# Patient Record
Sex: Male | Born: 1955 | Hispanic: Yes | Marital: Married | Smoking: Current every day smoker
Health system: Southern US, Community
[De-identification: ages and names within clinical notes are randomized; demographics above are authoritative.]

## PROBLEM LIST (undated history)

## (undated) DIAGNOSIS — I10 Essential (primary) hypertension: Secondary | ICD-10-CM

## (undated) DIAGNOSIS — E119 Type 2 diabetes mellitus without complications: Secondary | ICD-10-CM

## (undated) DIAGNOSIS — E7800 Pure hypercholesterolemia, unspecified: Secondary | ICD-10-CM

---

## 2012-09-23 DIAGNOSIS — E119 Type 2 diabetes mellitus without complications: Secondary | ICD-10-CM | POA: Insufficient documentation

## 2012-09-23 DIAGNOSIS — I10 Essential (primary) hypertension: Secondary | ICD-10-CM | POA: Insufficient documentation

## 2013-09-14 ENCOUNTER — Emergency Department: Payer: Self-pay | Admitting: Emergency Medicine

## 2015-02-22 IMAGING — CR DG KNEE COMPLETE 4+V*L*
1 series · 4 of 4 positions shown · non-contrast
Comparison: None.

CLINICAL DATA: Intermittent left knee pain for 2 months, no known
injury

EXAM:
LEFT KNEE - COMPLETE 4+ VIEW

[Series 1: t knee obl left · 0.14mm/px · 4 of 4 slices shown]
[im 1/4]
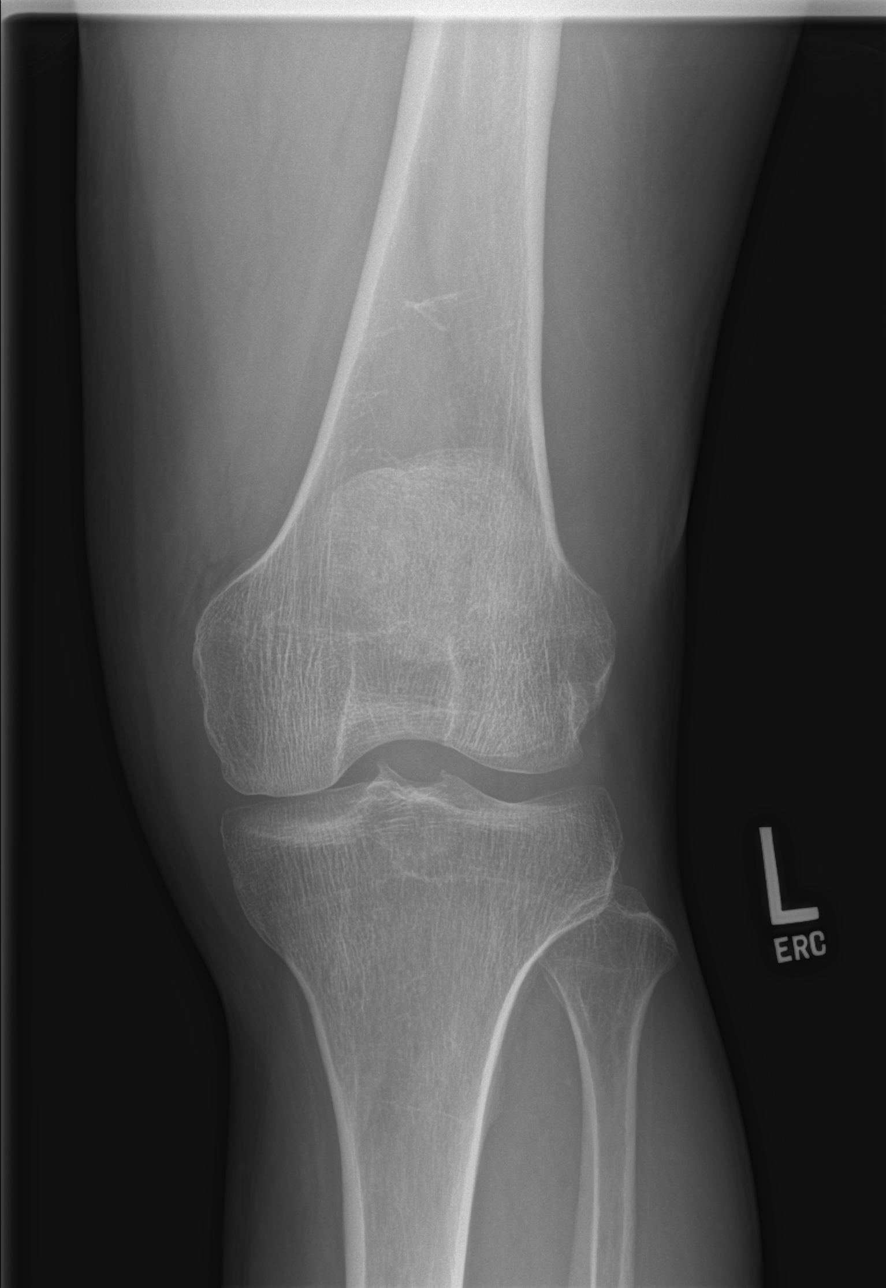
[im 2/4]
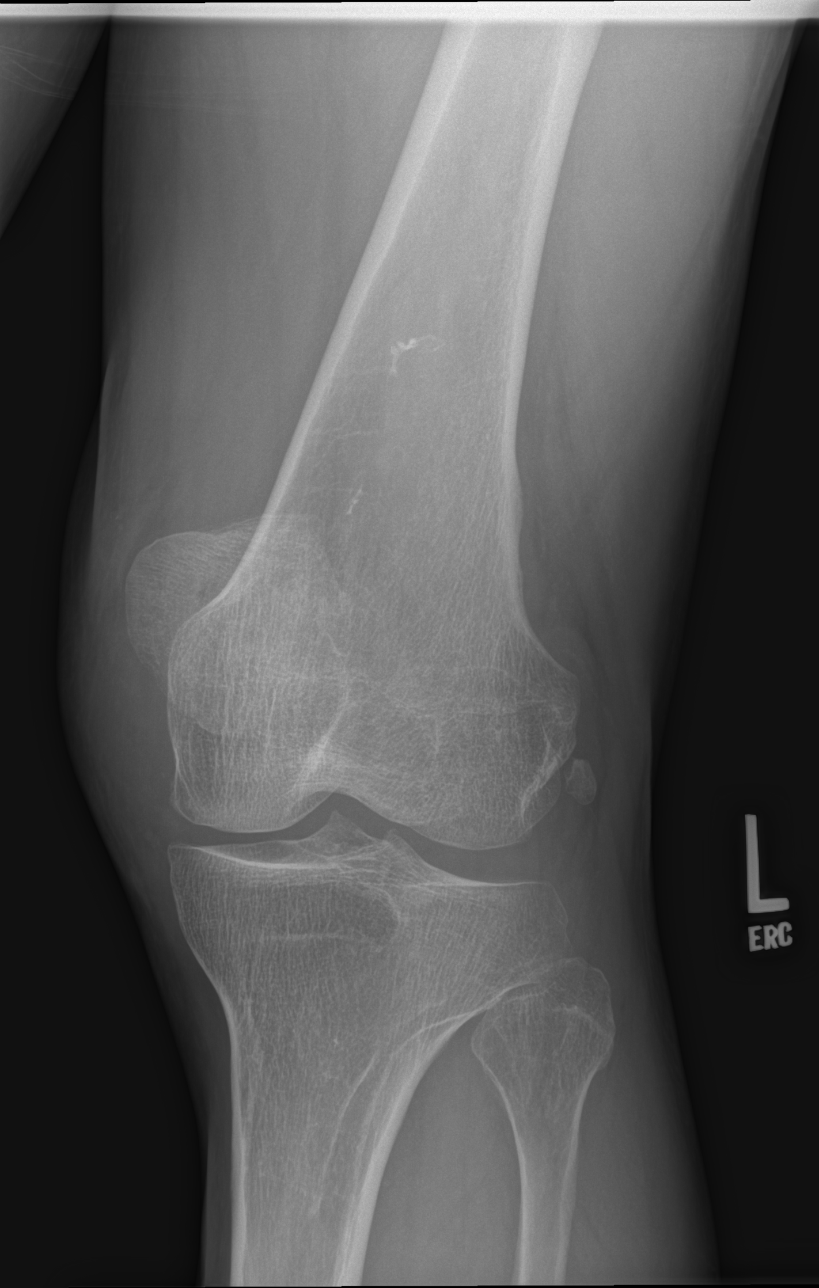
[im 3/4]
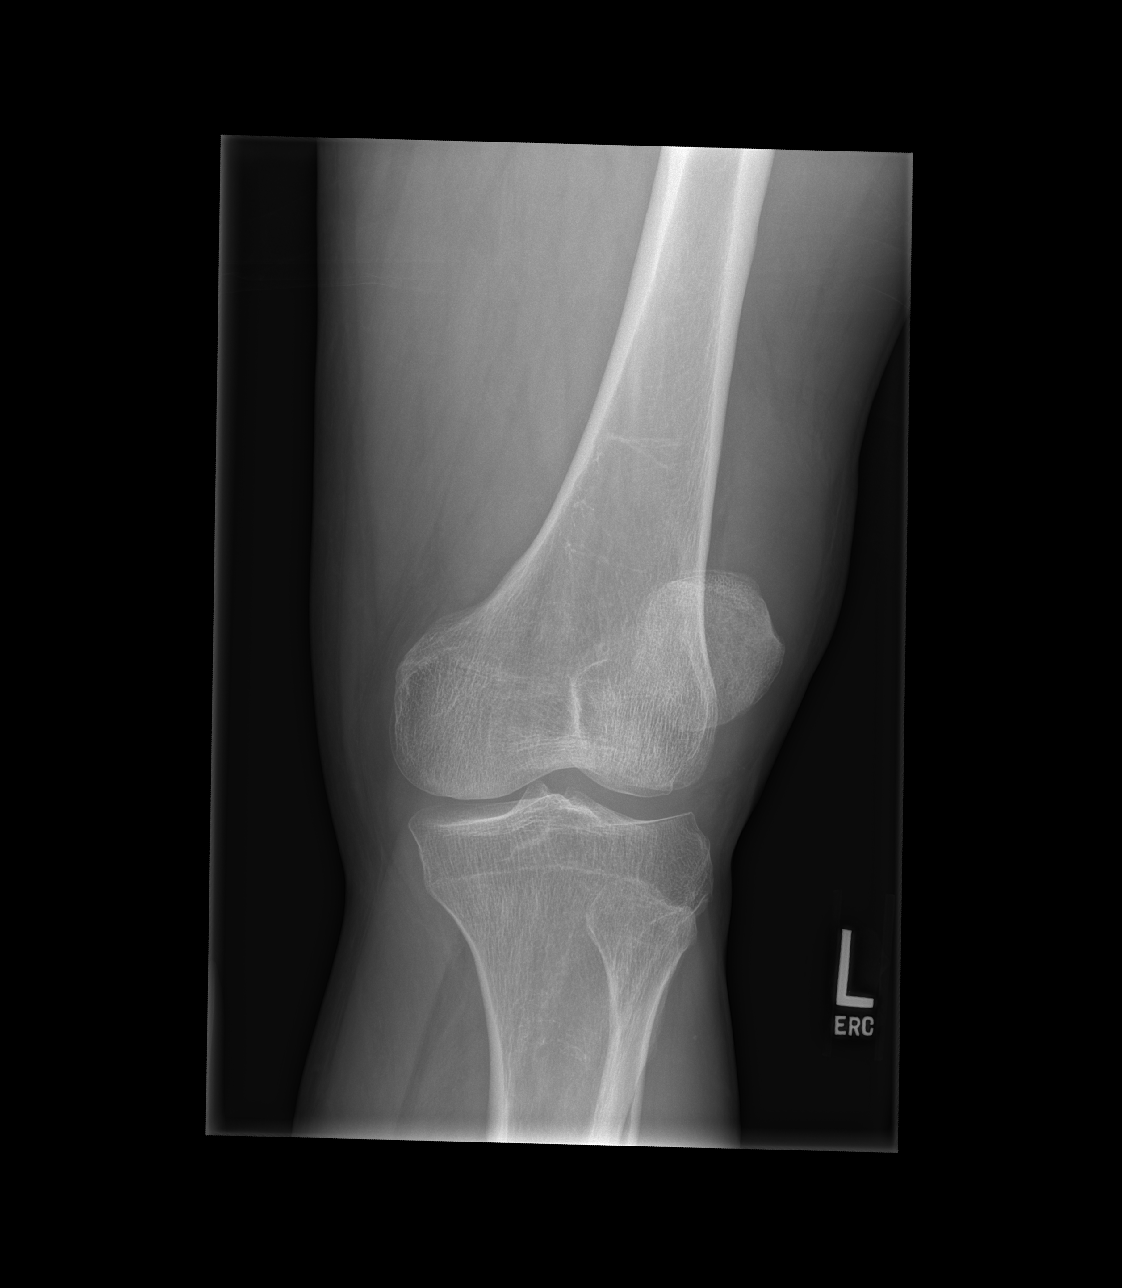
[im 4/4]
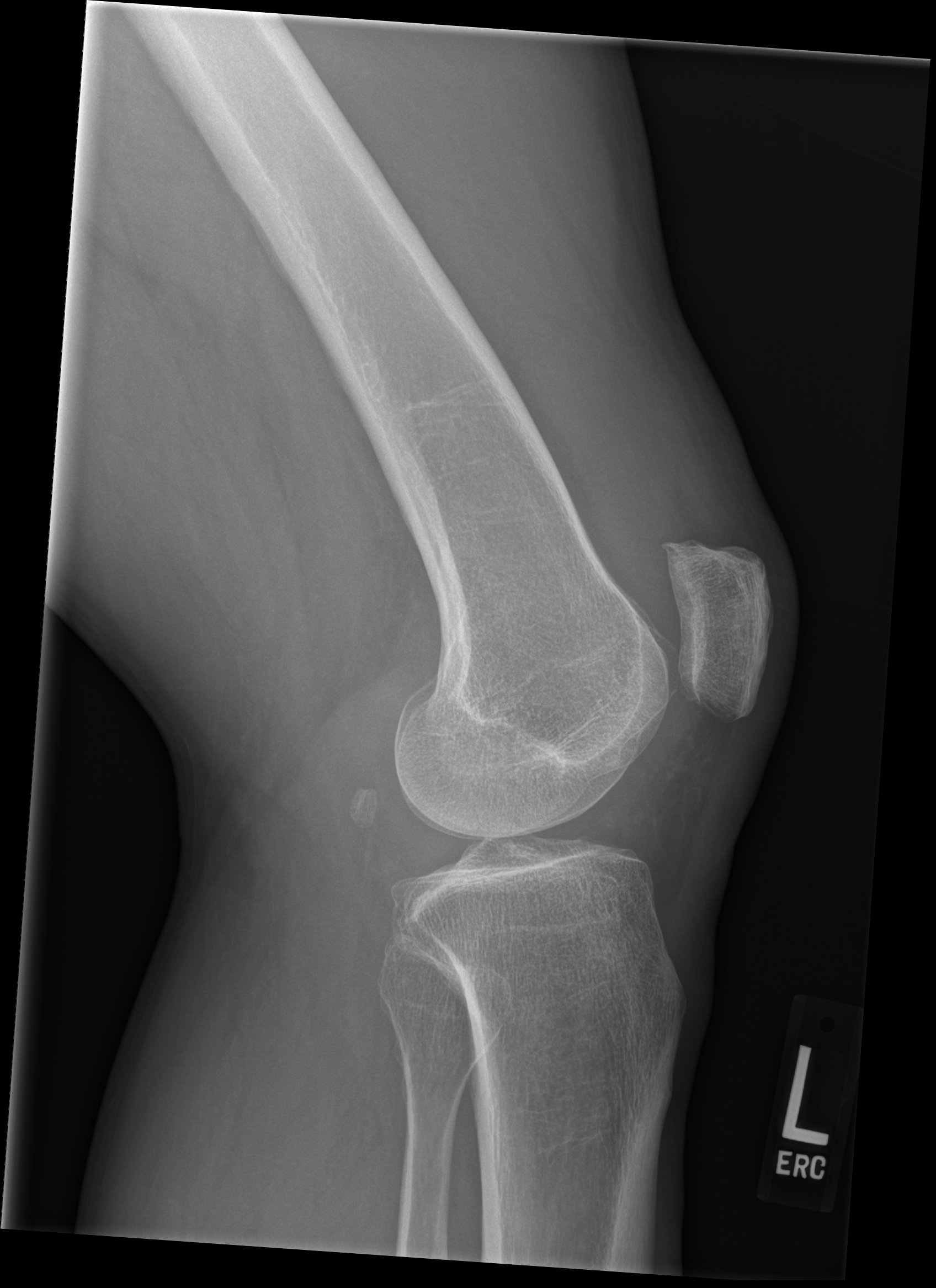

[4 of 4 positions shown; findings below may reference images not displayed]

FINDINGS: No fracture or dislocation. Mild tricompartmental degenerative
change, likely worse within the medial compartment and
patellofemoral joints with joint space loss, articular surface
irregularity, subchondral sclerosis and minimal osteophytosis. There
is minimal spurring of the tibial spines. No evidence of
chondrocalcinosis. Small suprapatellar joint effusion is suspected.
Benign linear sclerosis is noted within the distal femoral
diaphysis. Regional soft tissues are normal.
IMPRESSION: 1. Suspected small joint effusion.  Otherwise, no acute findings.
2. Mild tricompartmental degenerative change of the knee.

## 2015-12-08 ENCOUNTER — Emergency Department: Payer: No Typology Code available for payment source

## 2015-12-08 ENCOUNTER — Emergency Department
Admission: EM | Admit: 2015-12-08 | Discharge: 2015-12-09 | Disposition: A | Discharge status: Home / Self Care | Payer: No Typology Code available for payment source | Attending: Emergency Medicine | Admitting: Emergency Medicine

## 2015-12-08 ENCOUNTER — Encounter: Payer: Self-pay | Admitting: Emergency Medicine

## 2015-12-08 DIAGNOSIS — E119 Type 2 diabetes mellitus without complications: Secondary | ICD-10-CM | POA: Diagnosis not present

## 2015-12-08 DIAGNOSIS — S161XXA Strain of muscle, fascia and tendon at neck level, initial encounter: Secondary | ICD-10-CM | POA: Insufficient documentation

## 2015-12-08 DIAGNOSIS — Y999 Unspecified external cause status: Secondary | ICD-10-CM | POA: Diagnosis not present

## 2015-12-08 DIAGNOSIS — S46911A Strain of unspecified muscle, fascia and tendon at shoulder and upper arm level, right arm, initial encounter: Secondary | ICD-10-CM

## 2015-12-08 DIAGNOSIS — Y939 Activity, unspecified: Secondary | ICD-10-CM | POA: Insufficient documentation

## 2015-12-08 DIAGNOSIS — F17210 Nicotine dependence, cigarettes, uncomplicated: Secondary | ICD-10-CM | POA: Diagnosis not present

## 2015-12-08 DIAGNOSIS — I10 Essential (primary) hypertension: Secondary | ICD-10-CM | POA: Diagnosis not present

## 2015-12-08 DIAGNOSIS — T1490 Injury, unspecified: Secondary | ICD-10-CM | POA: Diagnosis present

## 2015-12-08 DIAGNOSIS — Y92411 Interstate highway as the place of occurrence of the external cause: Secondary | ICD-10-CM | POA: Insufficient documentation

## 2015-12-08 HISTORY — DX: Type 2 diabetes mellitus without complications: E11.9

## 2015-12-08 HISTORY — DX: Pure hypercholesterolemia, unspecified: E78.00

## 2015-12-08 HISTORY — DX: Essential (primary) hypertension: I10
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
## 2015-12-09 MED ORDER — NAPROXEN 500 MG PO TBEC: 500.0000 mg | DELAYED_RELEASE_TABLET | Freq: Two times a day (BID) | ORAL | Status: AC

## 2015-12-09 MED ORDER — TRAMADOL HCL 50 MG PO TABS: 50.0000 mg | ORAL_TABLET | Freq: Two times a day (BID) | ORAL | Status: AC

## 2015-12-09 MED ORDER — TRAMADOL HCL 50 MG PO TABS
50.0000 mg | ORAL_TABLET | Freq: Once | ORAL | Status: AC
  Administered 2015-12-09: 50 mg via ORAL
  Filled 2015-12-09: qty 1

## 2015-12-09 NOTE — ED Notes (Signed)
Called x-ray to ask status of pt's cervical scan. Was informed by tech that they would call the radiologist for status of results

## 2015-12-09 NOTE — ED Notes (Signed)
Used ipad translator to d/c patient
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
## 2015-12-09 NOTE — Discharge Instructions (Signed)
Distensin cervical (Cervical Sprain) La distensin cervical se produce cuando los tejidos (ligamentos) del cuello se estiran o se rompen. CUIDADOS EN EL HOGAR   Aplique hielo sobre la zona lesionada.  Ponga el hielo en una bolsa plstica.  Colquese una toalla entre la piel y la bolsa de hielo.  Deje el hielo durante 15 - 20 minutos y aplquelo 3 - 4 veces por Futures trader.  Es posible que le hayan indicado el uso de un collarn. Este collarn impide que el cuello se mueva mientras se [place].  No se lo quite excepto que se lo indique el mdico.  Si tiene el cabello largo, mantngalo fuera del collarn.  Consulte a su mdico antes de cambiar la posicin del collarn. Es posible que necesite cambiar la posicin con el tiempo para sentirse ms cmodo.  Si le permiten quitarse el collarn para lavarlo o darse un bao, siga las indicaciones de su mdico acerca de cmo hacerlo con seguridad.  Mantenga el collarn limpio pasando un pao con agua y [place]. Squelo bien. Si el collarn tiene almohadillas removibles, qutelas cada 1-2 das para lavarlas a mano con agua y [place]. Deje que se sequen al aire. Debe secarlas bien antes de volver a colocarlas en el collarn.  No conduzca vehculos mientras [place] el collarn.  Slo tome los medicamentos que le haya indicado su mdico.  Cumpla con las visitas al mdico segn las indicaciones.  Cumpla con las sesiones de fisioterapia, segn las indicaciones.  Ajuste su mesa de trabajo de modo que tenga una buena postura al trabajar.  Evite las posiciones y actividades que [employer] sntomas.  Haga un precalentamiento y elongacin adecuados antes de la [place]. SOLICITE AYUDA SI:  El dolor no se alivia con los [employer].  No puede disminuir las dosis de medicamentos para el dolor luego de un tiempo, segn lo planificado.  Su nivel de actividad no mejora segn lo esperado. SOLICITE AYUDA DE INMEDIATO SI:   Tiene sangrado.  Siente Restaurant manager, fast food.  Tiene alguna reaccin a los medicamentos.  Tiene sntomas nuevos que no puede explicar.  Pierde la sensibilidad (adormecimiento) o no Therapist, nutritional del cuerpo (parlisis).  Siente hormigueo o debilidad en alguna parte del cuerpo.  Los sntomas empeoran. Los sntomas son:  Dolor, sensibilidad, rigidez, inflamacin(hinchazn), o sensacin de ardor en el cuello.  Siente dolor cuando le tocan el cuello.  Dolor en el hombro o la zona superior de la espalda.  Capacidad limitada para mover el cuello.  Dolor de [place].  Mareos.  Debilidad, falta de sensibilidad o sensacin hormigueos en las manos o los brazos.  Espasmos musculares.  Dificultades para tragar o comer. ASEGRESE DE QUE:   Comprende estas instrucciones.  Controlar su afeccin.  Recibir ayuda de inmediato si no mejora o si empeora.   Esta informacin no tiene Theme park manager el consejo del mdico. Asegrese de hacerle al mdico cualquier pregunta que tenga.   Document Released: 08/02/2011 Document Revised: 04/15/2013 Elsevier Interactive Patient Education 2016 [employer].  Colisin con un vehculo de motor (Tourist information centre manager)  Luego de un choque con el automvil,(colisin en un vehculo de motor), es normal tener hematomas y [employer]. Durante las primeras 24 horas es cuando se Development worker, international aid. Luego, comenzar a Chiropodist.  CUIDADOS EN EL HOGAR  Aplique hielo sobre la zona lesionada.  Ponga el hielo en una bolsa plstica.  Colquese una toalla entre la piel y la bolsa de hielo.  Deje el hielo durante 15 a 20 minutos, 3 a 4 veces por da.  Beba gran cantidad de lquidos para mantener la orina de tono claro o color amarillo plido.  No beba alcohol.  Tome una ducha o un bao caliente 1 a 2 veces al C.H. Robinson Worldwideda. Esto ayudar a Manufacturing engineerdisminuir el dolor en los msculos.  Regrese a sus actividades segn las indicaciones del mdico. Janie Morningenga cuidado al levantar objetos  pesados. [place] pesos Social research officer, governmentpuede agravar el dolor de cuello o espalda.  Slo tome los medicamentos que le haya indicado el profesional. No tome aspirina. SOLICITE AYUDA DE INMEDIATO SI:   Tiene hormigueos en los brazos o las piernas, los siente dbiles o pierde la sensibilidad (estn adormecidos).  Le duele la cabeza y no mejora con medicamentos.  Siente dolor intenso en el cuello, especialmente sensibilidad en el centro de la espalda o el cuello.  No puede controlar la orina o las heces.  Siente un dolor en cualquier parte del cuerpo que empeora.  Le falta el aire, se siente mareado o se desvanece (se desmaya).  Siente dolor en el pecho.  Tiene malestar estomacal (nuseas, vmitos), o transpira.  Siente un dolor en el vientre (abdominal) que empeora.  Observa sangre en la orina, en las heces o en el vmito.  Siente dolor en los hombros (en la zona de los breteles).  Los sntomas empeoran. ASEGRESE DE QUE:   Comprende estas instrucciones.  Controlar la enfermedad.  Solicitar ayuda de inmediato si usted no mejora o si empeora.   Esta informacin no tiene Theme park managercomo fin reemplazar el consejo del mdico. Asegrese de hacerle al mdico cualquier pregunta que tenga.   Document Released: 09/15/2010 Document Revised: 11/05/2011 Elsevier Interactive Patient Education [employer]2016 Elsevier Inc.  Distensin muscular. (Muscle Strain) Una distensin muscular (estiramiento muscular) ocurre cuando un msculo se estira ms all de la longitud normal. Reece Agarcurre cuando una fuerza violenta bruscamente estira demasiado el msculo. Generalmente se desgarran algunas de las fibras del msculo. La distensin muscular es comn en los atletas. La recuperacin normalmente tarda de 1 a 2semanas. La curacin completa tarda de 5 a 6semanas.  CUIDADOS EN EL HOGAR   Siga el mtodo PRICE (por sus siglas en ingls) de tratamiento para que la lesin mejore. Hgalo durante los 2 a 3 primeros das despus de la  lesin:  Licensed conveyancerroteccin. Proteja el msculo para evitar que se vuelva a lesionar.  Reposo. Limite la actividad y descanse la parte del cuerpo lesionada.  Hielo. Ponga el hielo en una bolsa plstica. Coloque una toalla entre la piel y la bolsa de hielo. Luego aplique el hielo y djelo actuar de 15 a 20minutos por hora. Despus del Press photographertercer da, cambie a compresas de calor hmedo.  Compresin. Use una frula o venda elstica en la zona lesionada para brindar alivio. No la ajuste demasiado.  Elevacin. Eleve la zona lesionada por encima del nivel del corazn.  Solo tome los medicamentos que le haya indicado su mdico.  Realice un calentamiento antes de hacer ejercicio para prevenir distensiones musculares futuras. SOLICITE AYUDA SI:   Siente ms dolor o inflamacin (hinchazn) en la zona lesionada.  Siente adormecimiento, hormigueo o nota una prdida de fuerza en la zona lesionada. ASEGRESE DE QUE:   Comprende estas instrucciones.  Controlar su afeccin.  Recibir ayuda de inmediato si no mejora o si empeora.   Esta informacin no tiene Theme park managercomo fin reemplazar el consejo del mdico. Asegrese de hacerle al mdico cualquier pregunta que tenga.   Document Released: 11/09/2008  Document Revised: 06/03/2013 Elsevier Interactive Patient Education [employer] Inc.  Your exam and x-rays are normal today. You should take the prescription Naproxen for pain and inflammation as needed. Take the Ultram for more severe pain as needed. Apply ice packs to the neck and shoulders as needed. Follow-up with your provider as needed. Return to the emergency department for worsening symptoms as needed.  ......................................................................................................................................................................................Marland Kitchen

## 2015-12-09 NOTE — ED Notes (Signed)
Used translator to discharge pt. Reviewed d/c instructions, follow-up care, prescriptions, pain medication with pt. Pt verbalized understanding.
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
## 2017-08-30 DIAGNOSIS — Z0000 Encounter for general adult medical examination without abnormal findings: Secondary | ICD-10-CM | POA: Insufficient documentation

## 2019-09-21 DIAGNOSIS — Z6830 Body mass index (BMI) 30.0-30.9, adult: Secondary | ICD-10-CM | POA: Insufficient documentation

## 2019-09-28 DEATH — deceased

## 2021-11-24 DIAGNOSIS — E785 Hyperlipidemia, unspecified: Secondary | ICD-10-CM | POA: Insufficient documentation

## 2021-11-24 DIAGNOSIS — N400 Enlarged prostate without lower urinary tract symptoms: Secondary | ICD-10-CM | POA: Insufficient documentation

## 2022-03-23 ENCOUNTER — Other Ambulatory Visit: Payer: Self-pay

## 2022-03-23 ENCOUNTER — Emergency Department: Payer: Medicare Other

## 2022-03-23 ENCOUNTER — Emergency Department
Admission: EM | Admit: 2022-03-23 | Discharge: 2022-03-23 | Disposition: A | Discharge status: Home / Self Care | Payer: Medicare Other | Attending: Emergency Medicine | Admitting: Emergency Medicine

## 2022-03-23 DIAGNOSIS — R1013 Epigastric pain: Secondary | ICD-10-CM

## 2022-03-23 DIAGNOSIS — K529 Noninfective gastroenteritis and colitis, unspecified: Secondary | ICD-10-CM | POA: Diagnosis not present

## 2022-03-23 DIAGNOSIS — E119 Type 2 diabetes mellitus without complications: Secondary | ICD-10-CM | POA: Insufficient documentation

## 2022-03-23 DIAGNOSIS — R112 Nausea with vomiting, unspecified: Secondary | ICD-10-CM

## 2022-03-23 LAB — CBC
HCT: 43.3 % (ref 39.0–52.0)
Hemoglobin: 15.2 g/dL (ref 13.0–17.0)
MCH: 32.9 pg (ref 26.0–34.0)
MCHC: 35.1 g/dL (ref 30.0–36.0)
MCV: 93.7 fL (ref 80.0–100.0)
Platelets: 216 10*3/uL (ref 150–400)
RBC: 4.62 MIL/uL (ref 4.22–5.81)
RDW: 12.2 % (ref 11.5–15.5)
WBC: 12.1 10*3/uL — ABNORMAL HIGH (ref 4.0–10.5)
nRBC: 0 % (ref 0.0–0.2)

## 2022-03-23 LAB — URINALYSIS, ROUTINE W REFLEX MICROSCOPIC
Bilirubin Urine: NEGATIVE
Glucose, UA: >=500 mg/dL — AB
Hgb urine dipstick: NEGATIVE
Ketones, ur: 5 mg/dL — AB
Leukocytes,Ua: NEGATIVE
Nitrite: NEGATIVE
Protein, ur: NEGATIVE mg/dL
Specific Gravity, Urine: 1.028 (ref 1.005–1.030)
Squamous Epithelial / LPF: NONE SEEN (ref 0–5)
pH: 5 (ref 5.0–8.0)

## 2022-03-23 LAB — COMPREHENSIVE METABOLIC PANEL
ALT: 15 U/L (ref 0–44)
AST: 16 U/L (ref 15–41)
Albumin: 3.7 g/dL (ref 3.5–5.0)
Alkaline Phosphatase: 90 U/L (ref 38–126)
Anion gap: 10 (ref 5–15)
BUN: 13 mg/dL (ref 8–23)
CO2: 24 mmol/L (ref 22–32)
Calcium: 8.8 mg/dL — ABNORMAL LOW (ref 8.9–10.3)
Chloride: 101 mmol/L (ref 98–111)
Creatinine, Ser: 0.78 mg/dL (ref 0.61–1.24)
GFR, Estimated: >60 mL/min (ref >60)
Glucose, Bld: 124 mg/dL — ABNORMAL HIGH (ref 70–99)
Potassium: 3.2 mmol/L — ABNORMAL LOW (ref 3.5–5.1)
Sodium: 135 mmol/L (ref 135–145)
Total Bilirubin: 0.9 mg/dL (ref 0.3–1.2)
Total Protein: 7.2 g/dL (ref 6.5–8.1)

## 2022-03-23 LAB — LIPASE, BLOOD: Lipase: 86 U/L — ABNORMAL HIGH (ref 11–51)

## 2022-03-23 MED ORDER — FAMOTIDINE 40 MG PO TABS: 40.0000 mg | ORAL_TABLET | Freq: Every evening | ORAL | 0 refills | Status: AC

## 2022-03-23 MED ORDER — ONDANSETRON 8 MG PO TBDP: 8.0000 mg | ORAL_TABLET | Freq: Three times a day (TID) | ORAL | 0 refills | Status: AC | PRN

## 2022-03-23 MED ORDER — MORPHINE SULFATE (PF) 4 MG/ML IV SOLN
4.0000 mg | Freq: Once | INTRAVENOUS | Status: AC
  Administered 2022-03-23: 4 mg via INTRAVENOUS
  Filled 2022-03-23: qty 1

## 2022-03-23 MED ORDER — ONDANSETRON HCL 4 MG/2ML IJ SOLN
4.0000 mg | Freq: Once | INTRAMUSCULAR | Status: AC
  Administered 2022-03-23: 4 mg via INTRAVENOUS
  Filled 2022-03-23: qty 2

## 2022-03-23 MED ORDER — SODIUM CHLORIDE 0.9 % IV BOLUS
1000.0000 mL | Freq: Once | INTRAVENOUS | Status: AC
  Administered 2022-03-23: 1000 mL via INTRAVENOUS

## 2022-03-23 MED ORDER — IOHEXOL 300 MG/ML  SOLN
100.0000 mL | Freq: Once | INTRAMUSCULAR | Status: AC | PRN
  Administered 2022-03-23: 100 mL via INTRAVENOUS

## 2022-03-23 NOTE — ED Triage Notes (Signed)
Pt here with abd pain and diarrhea x3 days. Pt states the pain has been constant to the point where he cannot eat. Pt denies N/V.

## 2022-03-23 NOTE — ED Provider Notes (Signed)
Over the last  [facility] Provider Note   Event Date/Time   First MD Initiated Contact with Patient 03/23/22 [phone]     (approximate) History  Abdominal Pain  HPI Lawrence Mitchell is a 66 y.o. male with stated past medical history of diabetes who presents for midepigastric abdominal pain with associated nausea/vomiting/diarrhea 3 days.  Patient denies any recent travel, sick contacts, fevers, or food out of the ordinary. ROS: Patient currently denies any vision changes, tinnitus, difficulty speaking, facial droop, sore throat, chest pain, shortness of breath, melena, hematochezia, or hematemesis, dysuria, or weakness/numbness/paresthesias in any extremity   Physical Exam  Triage Vital Signs: ED Triage Vitals [03/23/22 0721]  Enc Vitals Group     BP (!) 144/71     Pulse Rate (!) 54     Resp 18     Temp 98.2 F (36.8 C)     Temp Source Oral     SpO2 97 %     Weight 210 lb 1.6 oz (95.3 kg)     Height 5\' 7"  (1.702 m)     Head Circumference      Peak Flow      Pain Score 10     Pain Loc      Pain Edu?      Excl. in GC?    Most recent vital signs: Vitals:   03/23/22 0721  BP: (!) 144/71  Pulse: (!) 54  Resp: 18  Temp: 98.2 F (36.8 C)  SpO2: 97%   General: Awake, oriented x4. CV:  Good peripheral perfusion.  Resp:  Normal effort.  Abd:  No distention.  Midepigastric tenderness to palpation Other:  Elderly Hispanic male laying in bed in no acute distress ED Results / Procedures / Treatments  Labs (all labs ordered are listed, but only abnormal results are displayed) Labs Reviewed  LIPASE, BLOOD - Abnormal; Notable for the following components:      Result Value   Lipase 86 (*)    All other components within normal limits  COMPREHENSIVE METABOLIC PANEL - Abnormal; Notable for the following components:   Potassium 3.2 (*)    Glucose, Bld 124 (*)    Calcium 8.8 (*)    All other components within normal limits  CBC - Abnormal; Notable for the  following components:   WBC 12.1 (*)    All other components within normal limits  URINALYSIS, ROUTINE W REFLEX MICROSCOPIC - Abnormal; Notable for the following components:   Color, Urine YELLOW (*)    APPearance CLEAR (*)    Glucose, UA >=500 (*)    Ketones, ur 5 (*)    Bacteria, UA RARE (*)    All other components within normal limits  RADIOLOGY ED MD interpretation: CT of the abdomen pelvis with IV contrast interpreted by me shows relative bowel wall thickening involving the small bowel in the left mid abdomen likely representing enteritis -Agree with radiology assessment Official radiology report(s): CT Abdomen Pelvis W Contrast  Result Date: 03/23/2022 CLINICAL DATA:  Abdominal pain and diarrhea for 3 days EXAM: CT ABDOMEN AND PELVIS WITH CONTRAST TECHNIQUE: Multidetector CT imaging of the abdomen and pelvis was performed using the standard protocol following bolus administration of intravenous contrast. RADIATION DOSE REDUCTION: This exam was performed according to the departmental dose-optimization program which includes automated exposure control, adjustment of the mA and/or kV according to patient size and/or use of iterative reconstruction technique. CONTRAST:  03/25/2022 OMNIPAQUE IOHEXOL 300 MG/ML  SOLN COMPARISON:  None Available.  FINDINGS: Lower chest: No acute abnormality. Hepatobiliary: No focal liver abnormality is seen. Relative low-attenuation of the liver as can be seen with hepatic steatosis. No gallstones, gallbladder wall thickening, or biliary dilatation. Pancreas: Unremarkable. No pancreatic ductal dilatation or surrounding inflammatory changes. Spleen: Normal in size without focal abnormality. Adrenals/Urinary Tract: Adrenal glands are unremarkable. Kidneys are normal, without renal calculi, focal lesion, or hydronephrosis. Bladder is unremarkable. Stomach/Bowel: Stomach is within normal limits. No bowel dilatation to suggest bowel obstruction. Relative bowel wall thickening  involving the small bowel in the left mid abdomen as can be seen with enteritis secondary to an infectious or inflammatory etiology. Vascular/Lymphatic: Aortic atherosclerosis. No enlarged abdominal or pelvic lymph nodes. Reproductive: Prostate is unremarkable. Other: No abdominal wall hernia or abnormality. No abdominopelvic ascites. Musculoskeletal: No acute osseous abnormality. No aggressive osseous lesion. Mild osteoarthritis of bilateral SI joints. At L4-5 there is moderate right facet arthropathy with foraminal stenosis. There is severe left facet arthropathy at L5-S1 with left foraminal stenosis. Grade 1 anterolisthesis of L5 on S1. IMPRESSION: 1. Relative bowel wall thickening involving the small bowel in the left mid abdomen as can be seen with enteritis secondary to an infectious or inflammatory etiology. 2. Hepatic steatosis. 3. Aortic Atherosclerosis (ICD10-I70.0). Electronically Signed   By: Kathreen Devoid M.D.   On: 03/23/2022 09:06   PROCEDURES: Critical Care performed: No .1-3 Lead EKG Interpretation  Performed by: Naaman Plummer, MD Authorized by: Naaman Plummer, MD     Interpretation: normal     ECG rate:  64   ECG rate assessment: normal     Rhythm: sinus rhythm     Ectopy: none     Conduction: normal    MEDICATIONS ORDERED IN ED: Medications  sodium chloride 0.9 % bolus 1,000 mL (1,000 mLs Intravenous New Bag/Given 03/23/22 0829)  ondansetron (ZOFRAN) injection 4 mg (4 mg Intravenous Given 03/23/22 0830)  morphine (PF) 4 MG/ML injection 4 mg (4 mg Intravenous Given 03/23/22 0830)  iohexol (OMNIPAQUE) 300 MG/ML solution 100 mL (100 mLs Intravenous Contrast Given 03/23/22 0853)   IMPRESSION / MDM / ASSESSMENT AND PLAN / ED COURSE  I reviewed the triage vital signs and the nursing notes.                             The patient is on the cardiac monitor to evaluate for evidence of arrhythmia and/or significant heart rate changes. Patient's presentation is most consistent with  acute presentation with potential threat to life or bodily function. Patient presents for acute nausea/vomiting The cause of the patients symptoms is not clear, but the patient is overall well appearing and is suspected to have a transient course of illness.  Given History and Exam there does not appear to be an emergent cause of the symptoms such as small bowel obstruction, coronary syndrome, bowel ischemia, DKA, pancreatitis, appendicitis, other acute abdomen or other emergent problem.  Reassessment: After treatment, the patient is feeling much better, tolerating PO fluids, and shows no signs of dehydration.   Disposition: Discharge home with prompt primary care physician follow up in the next 48 hours. Strict return precautions discussed.   FINAL CLINICAL IMPRESSION(S) / ED DIAGNOSES   Final diagnoses:  Enteritis  Nausea vomiting and diarrhea  Epigastric pain   Rx / DC Orders   ED Discharge Orders          Ordered    ondansetron (ZOFRAN-ODT) 8 MG disintegrating tablet  Every 8 hours PRN        03/23/22 0920    famotidine (PEPCID) 40 MG tablet  Every evening        03/23/22 8757           Note:  This document was prepared using Dragon voice recognition software and may include unintentional dictation errors.   Merwyn Katos, MD 03/23/22 [phone]

## 2022-06-05 ENCOUNTER — Other Ambulatory Visit: Payer: Self-pay | Admitting: Obstetrics

## 2022-06-05 ENCOUNTER — Other Ambulatory Visit (HOSPITAL_COMMUNITY): Payer: Self-pay | Admitting: Obstetrics

## 2022-06-05 DIAGNOSIS — Z87891 Personal history of nicotine dependence: Secondary | ICD-10-CM

## 2022-06-05 DIAGNOSIS — F17209 Nicotine dependence, unspecified, with unspecified nicotine-induced disorders: Secondary | ICD-10-CM

## 2022-06-06 ENCOUNTER — Other Ambulatory Visit: Payer: Self-pay | Admitting: Obstetrics

## 2022-06-06 DIAGNOSIS — Z87891 Personal history of nicotine dependence: Secondary | ICD-10-CM

## 2022-06-06 DIAGNOSIS — Z136 Encounter for screening for cardiovascular disorders: Secondary | ICD-10-CM

## 2022-06-11 ENCOUNTER — Ambulatory Visit: Payer: Medicare Other

## 2022-12-26 ENCOUNTER — Other Ambulatory Visit: Payer: Self-pay

## 2023-01-02 ENCOUNTER — Other Ambulatory Visit: Payer: Self-pay

## 2023-01-07 ENCOUNTER — Encounter: Payer: Self-pay | Admitting: Physician Assistant

## 2023-01-07 ENCOUNTER — Ambulatory Visit (INDEPENDENT_AMBULATORY_CARE_PROVIDER_SITE_OTHER): Payer: Medicare HMO | Admitting: Physician Assistant

## 2023-01-07 ENCOUNTER — Other Ambulatory Visit: Payer: Self-pay

## 2023-01-07 VITALS — BP 138/75 | HR 55 | Temp 98.1°F | Ht 62.0 in | Wt 180.0 lb

## 2023-01-07 DIAGNOSIS — R1013 Epigastric pain: Secondary | ICD-10-CM

## 2023-01-07 DIAGNOSIS — R748 Abnormal levels of other serum enzymes: Secondary | ICD-10-CM

## 2023-01-07 DIAGNOSIS — K760 Fatty (change of) liver, not elsewhere classified: Secondary | ICD-10-CM

## 2023-01-07 DIAGNOSIS — Z1211 Encounter for screening for malignant neoplasm of colon: Secondary | ICD-10-CM | POA: Diagnosis not present

## 2023-01-07 DIAGNOSIS — R109 Unspecified abdominal pain: Secondary | ICD-10-CM

## 2023-01-07 MED ORDER — PEG 3350-KCL-NA BICARB-NACL 420 G PO SOLR: 4000.0000 mL | Freq: Once | ORAL | 0 refills | Status: AC

## 2023-01-07 NOTE — Patient Instructions (Addendum)
Ultrasound scheduled 01/14/23 @ 8 am. Nothing to eat/drink after midnight.   Outpatient imaging [address] [place]

## 2023-01-07 NOTE — Progress Notes (Signed)
Celso Amy, PA-C [address]  [address]  [place], [place] [address]  Main: [phone]  Fax: [phone]   Gastroenterology Consultation  Referring Provider:     [facility] Primary Care Physician:  Guy Begin, MD Primary Gastroenterologist:  Celso Amy, PA-C / Dr. Wyline Mood  Reason for Consultation:     Evaded alkaline phosphatase, Epigastric Pain        HPI:   Lawrence Mitchell is a 67 y.o. y/o male referred for consultation & management  by Guy Begin, MD at [facility].  We are using Spanish translator today [employer], #[id number]).  Referred to evaluate elevated alkaline phosphatase.  Labs 12/25/2022 showed elevated alkaline phosphatase 202.  All other LFTs normal.  AST 12, ALT 32, total bilirubin 0.4.  Normal BUN 12, creatinine 0.68, GFR 101.  Normal CBC with hemoglobin 14, WBC 9.4, platelets 293.  Fasting lipid panel showed normal total cholesterol 93, triglycerides 88, HDL 43, LDL 33.  Patient states he has intermittent epigastric pain which comes and goes.  It is relieved after he takes omeprazole.  He only takes omeprazole as needed when he has epigastric pain.  He denies nausea, vomiting, weight loss, or any other GI symptoms.  Last abdominal imaging: CT abdomen pelvis with contrast 02/2022 showed hepatic steatosis with no liver masses.  Incidental enteritis (infectious).  Medical history significant for diabetes, first-degree AV block with bradycardia, hypertension, hyperlipidemia, smoker.  Recent hemoglobin A1c 8.0.  Takes metformin, glipizide, and Invokana.  Had acute alcoholic hepatitis in 2000.  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  Has had previous abdominal hernia repair in 2012 at [facility], and previous appendectomy.  No previous GI evaluation, EGD, or colonoscopy.   Past Medical History:  Diagnosis Date   Diabetes mellitus without complication (HCC)    High cholesterol    Hypertension      History reviewed. No pertinent surgical history.  Prior to Admission medications   Medication Sig Start Date End Date Taking? Authorizing Provider  famotidine (PEPCID) 40 MG tablet Take 1 tablet (40 mg total) by mouth every evening. 03/23/22 04/22/22  Merwyn Katos, MD  metFORMIN (GLUCOPHAGE) 500 MG tablet Take by mouth. 12/15/21   [provider]  naproxen (EC NAPROSYN) 500 MG EC tablet Take 1 tablet (500 mg total) by mouth 2 (two) times daily with a meal. 12/09/15   Menshew, Charlesetta Ivory, PA-C  ondansetron (ZOFRAN-ODT) 8 MG disintegrating tablet Take 1 tablet (8 mg total) by mouth every 8 (eight) hours as needed for nausea or vomiting. 03/23/22   Merwyn Katos, MD  traMADol (ULTRAM) 50 MG tablet Take 1 tablet (50 mg total) by mouth 2 (two) times daily. 12/09/15   Menshew, Charlesetta Ivory, PA-C    History reviewed. No pertinent family history.   Social History   Tobacco Use   Smoking status: Every Day    Packs/day: .5    Types: Cigarettes   Smokeless tobacco: Never  Substance Use Topics   Alcohol use: No    Allergies as of 01/07/2023   (No Known Allergies)    Review of Systems:    All systems reviewed and negative except where noted in HPI.   Physical Exam:  BP 138/75   Pulse (!) 55   Temp 98.1 F (36.7 C)   Ht 5\' 2"  (1.575 m)   Wt 180 lb (81.6 kg)   BMI 32.92 kg/m  No LMP for male patient. Psych:  Alert and cooperative. Normal mood and affect. General:   Alert,  Well-developed, well-nourished, pleasant and cooperative in NAD Head:  Normocephalic and atraumatic. Eyes:  Sclera clear, no icterus.   Conjunctiva pink. Ears:  Normal auditory acuity. Neck:  Supple; no masses or thyromegaly. Lungs:  Respirations even and unlabored.  Clear throughout to auscultation.   No wheezes, crackles, or rhonchi. No acute distress. Heart:  Regular rate and rhythm; no murmurs, clicks, rubs, or gallops. Abdomen:  Normal bowel sounds.  No bruits.  Soft, non-tender and  non-distended without masses, hepatosplenomegaly or hernias noted.  No guarding or rebound tenderness.  There is no abdominal tenderness at all today. Neurologic:  Alert and oriented x3;  grossly normal neurologically. Psych:  Alert and cooperative. Normal mood and affect.  Imaging Studies: No results found.  Assessment and Plan:   Lawrence Mitchell is a 67 y.o. y/o male has been referred for Elevated Alkaline Phosphatase.  Left ear is normal.  CT scan done in 2023 showed hepatic steatosis.  He has intermittent epigastric pain which is relieved with taking omeprazole.  I am suspicious for gastritis.  He still has gallbladder.  He has never had a colonoscopy or EGD.  I am ordering lab work, RUQ ultrasound, and EGD for further evaluation of elevated elevated alkaline phosphatase and intermittent epigastric pain.  Differential includes gastritis and cholelithiasis.  I am also scheduling his for screening colonoscopy due to age.     Elevated alkaline phosphatase Order RUQ ultrasound Labs: GGT, AMA, hepatic panel  2.   Fatty Liver  Recommend low-fat diet, regular exercise, and weight loss.  Patient education handout was given.  3.  Epigastric Pain - Improved on Omeprazole; Suspect Gastritis  Scheduling EGD I discussed risks of EGD with patient to include risk of bleeding, perforation, and risk of sedation.   Patient expressed understanding and agrees to proceed with EGD.   4.   Colon Cancer Screening - He has never had a colonoscopy  Scheduling First Screening Colonoscopy I discussed risks of colonoscopy with patient to include risk of bleeding, colon perforation, and risk of sedation.   Patient expressed understanding and agrees to proceed with colonoscopy.   Follow up in 4 weeks after EGD / Colon with TG.  Celso Amy, PA-C

## 2023-01-08 LAB — MITOCHONDRIAL/SMOOTH MUSCLE AB PNL: Smooth Muscle Ab: 3 Units (ref 0–19)

## 2023-01-08 LAB — PTH, INTACT AND CALCIUM: Calcium: 9.7 mg/dL (ref 8.6–10.2)

## 2023-01-08 LAB — ALKALINE PHOSPHATASE, ISOENZYMES: Alkaline Phosphatase: 173 IU/L — ABNORMAL HIGH (ref 44–121)

## 2023-01-08 LAB — ANA: Anti Nuclear Antibody (ANA): NEGATIVE

## 2023-01-08 LAB — TSH: TSH: 0.843 u[IU]/mL (ref 0.450–4.500)

## 2023-01-13 LAB — GAMMA GT: GGT: 44 IU/L (ref 0–65)

## 2023-01-13 LAB — ALKALINE PHOSPHATASE, ISOENZYMES: BONE FRACTION: 55 % (ref 12–68)

## 2023-01-13 LAB — MITOCHONDRIAL/SMOOTH MUSCLE AB PNL: Mitochondrial Ab: <20 Units (ref 0.0–20.0)

## 2023-01-13 LAB — PTH, INTACT AND CALCIUM: PTH: 25 pg/mL (ref 15–65)

## 2023-01-14 ENCOUNTER — Ambulatory Visit
Admission: RE | Admit: 2023-01-14 | Discharge: 2023-01-14 | Disposition: A | Discharge status: Home / Self Care | Payer: Medicare HMO | Source: Ambulatory Visit | Attending: Physician Assistant | Admitting: Physician Assistant

## 2023-01-14 DIAGNOSIS — R109 Unspecified abdominal pain: Secondary | ICD-10-CM

## 2023-01-15 ENCOUNTER — Telehealth: Payer: Self-pay

## 2023-01-15 NOTE — Telephone Encounter (Signed)
Called patient to let him know the following information:  Celso Amy, Cordelia Poche 01/14/2023  4:34 PM EDT Back to Top    Pt. Needs Engineer, structural.  Notify patient: Alkaline phosphatase has improved from 202 to 173.  All other liver test are normal.  Calcium and PTH are normal.  No evidence of hyperparathyroidism.  Thyroid test is normal.  ANA, AMA, and ASMA are normal / negative.  No evidence of autoimmune hepatitis or primary biliary cirrhosis.  Continue with plan for low-fat diet, EGD and colonoscopy as scheduled.  Also see abdominal ultrasound results.   Patient understood and had no further questions.

## 2023-01-22 NOTE — Progress Notes (Signed)
Needs a Engineer, structural. Notify patient: abdominal ultrasound shows no evidence of gallstones or gallbladder infection. There is evidence of a 4 mm gallbladder polyp. Recommend repeat gallbladder ultrasound in 6 months. Common bile duct is upper limits of normal at 6 mm. If patient starts having right upper quadrant pain, then we will need to order abdominal MRI/MRCP.

## 2023-01-23 ENCOUNTER — Telehealth: Payer: Self-pay

## 2023-01-23 NOTE — Telephone Encounter (Signed)
Called patient and had to leave him a detailed message with the below information. I will send a message to Velna Hatchet so she could get reminded to schedule patient an ultrasound in 6 months per Celso Amy, PA-C.

## 2023-01-23 NOTE — Telephone Encounter (Signed)
-----   Message from Celso Amy, [place] sent at 01/22/2023  4:05 PM EDT ----- Needs a Spanish translator. Notify patient: abdominal ultrasound shows no evidence of gallstones or gallbladder infection. There is evidence of a 4 mm gallbladder polyp. Recommend repeat gallbladder ultrasound in 6 months. Common bile duct is upper limits of normal at 6 mm. If patient starts having right upper quadrant pain, then we will need to order abdominal MRI/MRCP.

## 2023-03-14 ENCOUNTER — Ambulatory Visit: Payer: Medicare HMO | Admitting: Registered Nurse

## 2023-03-14 ENCOUNTER — Ambulatory Visit
Admission: RE | Admit: 2023-03-14 | Discharge: 2023-03-14 | Disposition: A | Discharge status: Home / Self Care | Payer: Medicare HMO | Attending: Gastroenterology | Admitting: Gastroenterology

## 2023-03-14 ENCOUNTER — Encounter
Admission: RE | Disposition: A | Discharge status: Home / Self Care | Payer: Self-pay | Source: Home / Self Care | Attending: Gastroenterology

## 2023-03-14 ENCOUNTER — Encounter: Payer: Self-pay | Admitting: Gastroenterology

## 2023-03-14 ENCOUNTER — Other Ambulatory Visit: Payer: Self-pay

## 2023-03-14 DIAGNOSIS — D120 Benign neoplasm of cecum: Secondary | ICD-10-CM | POA: Insufficient documentation

## 2023-03-14 DIAGNOSIS — F17210 Nicotine dependence, cigarettes, uncomplicated: Secondary | ICD-10-CM | POA: Diagnosis not present

## 2023-03-14 DIAGNOSIS — D125 Benign neoplasm of sigmoid colon: Secondary | ICD-10-CM | POA: Diagnosis not present

## 2023-03-14 DIAGNOSIS — R1013 Epigastric pain: Secondary | ICD-10-CM

## 2023-03-14 DIAGNOSIS — K2970 Gastritis, unspecified, without bleeding: Secondary | ICD-10-CM | POA: Diagnosis not present

## 2023-03-14 DIAGNOSIS — B9681 Helicobacter pylori [H. pylori] as the cause of diseases classified elsewhere: Secondary | ICD-10-CM | POA: Insufficient documentation

## 2023-03-14 DIAGNOSIS — R748 Abnormal levels of other serum enzymes: Secondary | ICD-10-CM

## 2023-03-14 DIAGNOSIS — R109 Unspecified abdominal pain: Secondary | ICD-10-CM

## 2023-03-14 DIAGNOSIS — K760 Fatty (change of) liver, not elsewhere classified: Secondary | ICD-10-CM

## 2023-03-14 DIAGNOSIS — D126 Benign neoplasm of colon, unspecified: Secondary | ICD-10-CM

## 2023-03-14 DIAGNOSIS — Z1211 Encounter for screening for malignant neoplasm of colon: Secondary | ICD-10-CM | POA: Diagnosis not present

## 2023-03-14 DIAGNOSIS — D122 Benign neoplasm of ascending colon: Secondary | ICD-10-CM | POA: Diagnosis not present

## 2023-03-14 HISTORY — PX: POLYPECTOMY: SHX5525

## 2023-03-14 HISTORY — PX: ESOPHAGOGASTRODUODENOSCOPY: SHX5428

## 2023-03-14 HISTORY — PX: BIOPSY: SHX5522

## 2023-03-14 HISTORY — PX: COLONOSCOPY WITH PROPOFOL: SHX5780

## 2023-03-14 SURGERY — COLONOSCOPY WITH PROPOFOL
Anesthesia: General

## 2023-03-14 MED ORDER — SODIUM CHLORIDE 0.9 % IV SOLN: INTRAVENOUS | Status: DC

## 2023-03-14 MED ORDER — PROPOFOL 500 MG/50ML IV EMUL
INTRAVENOUS | Status: DC | PRN
  Administered 2023-03-14: 150 ug/kg/min via INTRAVENOUS

## 2023-03-14 MED ORDER — GLYCOPYRROLATE 0.2 MG/ML IJ SOLN
INTRAMUSCULAR | Status: DC | PRN
  Administered 2023-03-14: .2 mg via INTRAVENOUS

## 2023-03-14 MED ORDER — PROPOFOL 10 MG/ML IV BOLUS
INTRAVENOUS | Status: DC | PRN
Start: 2023-03-14 — End: 2023-03-14
  Administered 2023-03-14: 50 mg via INTRAVENOUS
  Administered 2023-03-14 (×2): 20 mg via INTRAVENOUS

## 2023-03-14 MED ORDER — LIDOCAINE HCL (CARDIAC) PF 100 MG/5ML IV SOSY
PREFILLED_SYRINGE | INTRAVENOUS | Status: DC | PRN
  Administered 2023-03-14: 40 mg via INTRAVENOUS

## 2023-03-14 NOTE — Anesthesia Procedure Notes (Signed)
Date/Time: 03/14/2023 9:03 AM  Performed by: Ginger Carne, CRNAPre-anesthesia Checklist: Emergency Drugs available, Patient identified, Suction available, Patient being monitored and Timeout performed Patient Re-evaluated:Patient Re-evaluated prior to induction Oxygen Delivery Method: Nasal cannula Preoxygenation: Pre-oxygenation with 100% oxygen Induction Type: IV induction

## 2023-03-14 NOTE — Transfer of Care (Signed)
Immediate Anesthesia Transfer of Care Note  Patient: Lawrence Mitchell  Procedure(s) Performed: COLONOSCOPY WITH PROPOFOL ESOPHAGOGASTRODUODENOSCOPY (EGD) BIOPSY POLYPECTOMY  Patient Location: PACU  Anesthesia Type:General  Level of Consciousness: drowsy  Airway & Oxygen Therapy: Patient Spontanous Breathing  Post-op Assessment: Report given to RN and Post -op Vital signs reviewed and stable  Post vital signs: stable  Last Vitals:  Vitals Value Taken Time  BP 113/71 03/14/23 0942  Temp 35.7 C 03/14/23 0942  Pulse 38 03/14/23 0946  Resp 12 03/14/23 0946  SpO2 99 % 03/14/23 0946  Vitals shown include unfiled device data.  Last Pain:  Vitals:   03/14/23 0942  TempSrc: Temporal  PainSc: Asleep         Complications: No notable events documented.

## 2023-03-14 NOTE — H&P (Signed)
Wyline Mood, MD [address], [place], [place], [address] [address], [place], [place], [address] Phone: [phone]  Fax: [phone]  Primary Care Physician:  Guy Begin, MD   Pre-Procedure History & Physical: HPI:  Lawrence Mitchell is a 67 y.o. male is here for an endoscopy and colonoscopy    Past Medical History:  Diagnosis Date   Diabetes mellitus without complication (HCC)    High cholesterol    Hypertension     History reviewed. No pertinent surgical history.  Prior to Admission medications   Medication Sig Start Date End Date Taking? Authorizing Provider  metFORMIN (GLUCOPHAGE) 500 MG tablet Take by mouth. 12/15/21  Yes [provider]  famotidine (PEPCID) 40 MG tablet Take 1 tablet (40 mg total) by mouth every evening. 03/23/22 04/22/22  Merwyn Katos, MD  naproxen (EC NAPROSYN) 500 MG EC tablet Take 1 tablet (500 mg total) by mouth 2 (two) times daily with a meal. 12/09/15   Menshew, Charlesetta Ivory, PA-C  omeprazole (PRILOSEC) 40 MG capsule Take 40 mg by mouth daily. Takes As Needed    [provider]  ondansetron (ZOFRAN-ODT) 8 MG disintegrating tablet Take 1 tablet (8 mg total) by mouth every 8 (eight) hours as needed for nausea or vomiting. 03/23/22   Merwyn Katos, MD  traMADol (ULTRAM) 50 MG tablet Take 1 tablet (50 mg total) by mouth 2 (two) times daily. 12/09/15   Menshew, Charlesetta Ivory, PA-C    Allergies as of 01/07/2023   (No Known Allergies)    History reviewed. No pertinent family history.  Social History   Socioeconomic History   Marital status: Married    Spouse name: Not on file   Number of children: Not on file   Years of education: Not on file   Highest education level: Not on file  Occupational History   Not on file  Tobacco Use   Smoking status: Every Day    Current packs/day: 0.50    Types: Cigarettes   Smokeless tobacco: Never  Substance and Sexual Activity   Alcohol use: No   Drug  use: Not on file   Sexual activity: Not on file  Other Topics Concern   Not on file  Social History Narrative   Not on file   Social Determinants of Health   Financial Resource Strain: Low Risk  (11/24/2021)   Received from [facility]   Overall Financial Resource Strain (CARDIA)    Difficulty of Paying Living Expenses: Not hard at all  Food Insecurity: No Food Insecurity (11/24/2021)   Received [facility]   Hunger Vital Sign    Worried About Running Out of Food in the Last Year: Never true    Ran Out of Food in the Last Year: Never true  Transportation Needs: No Transportation Needs (11/24/2021)   Received from [facility]   PRAPARE - Transportation    Lack of Transportation (Medical): No    Lack of Transportation (Non-Medical): No  Physical Activity: Not on file  Stress: Not on file  Social Connections: Not on file  Intimate Partner Violence: Not At Risk (11/24/2021)   Received from [facility]   Humiliation, Afraid, Rape, and Kick questionnaire    Fear of Current or Ex-Partner: No    Emotionally Abused: No    Physically Abused: No    Sexually Abused: No    Review of Systems: See HPI, otherwise negative ROS  Physical Exam: BP (!) 207/86   Pulse (!) 47   Temp (!) 96.6 F (35.9 C) (Temporal)   Resp 16   Ht 5\' 5"  (1.651 m)   Wt 81.2 kg   SpO2 99%   BMI 29.79 kg/m  General:   Alert,  pleasant and cooperative in NAD Head:  Normocephalic and atraumatic. Neck:  Supple; no masses or thyromegaly. Lungs:  Clear throughout to auscultation, normal respiratory effort.    Heart:  +S1, +S2, Regular rate and rhythm, No edema. Abdomen:  Soft, nontender and nondistended. Normal bowel sounds, without guarding, and without rebound.   Neurologic:  Alert and  oriented x4;  grossly normal neurologically.  Impression/Plan: Lawrence Mitchell is here for an endoscopy and colonoscopy  to be performed for  evaluation of abdominal pain and colon cancer screening      Risks, benefits, limitations, and alternatives regarding endoscopy have been reviewed with the patient.  Questions have been answered.  All parties agreeable.   Wyline Mood, MD  03/14/2023, 8:57 AM

## 2023-03-14 NOTE — Anesthesia Preprocedure Evaluation (Addendum)
Anesthesia Evaluation  Patient identified by MRN, date of birth, ID band Patient awake    Reviewed: Allergy & Precautions, NPO status , Patient's Chart, lab work & pertinent test results  History of Anesthesia Complications Negative for: history of anesthetic complications  Airway Mallampati: III  TM Distance: >3 FB Neck ROM: full    Dental  (+) Upper Dentures, Missing   Pulmonary Current Smoker   Pulmonary exam normal        Cardiovascular hypertension, On Medications Normal cardiovascular exam     Neuro/Psych negative neurological ROS  negative psych ROS   GI/Hepatic negative GI ROS, Neg liver ROS,,,  Endo/Other  diabetes, Type 2, Oral Hypoglycemic Agents    Renal/GU negative Renal ROS  negative genitourinary   Musculoskeletal   Abdominal   Peds  Hematology negative hematology ROS (+)   Anesthesia Other Findings Past Medical History: No date: Diabetes mellitus without complication (HCC) No date: High cholesterol No date: Hypertension  History reviewed. No pertinent surgical history.  BMI    Body Mass Index: 29.79 kg/m      Reproductive/Obstetrics negative OB ROS                             Anesthesia Physical Anesthesia Plan  ASA: 2  Anesthesia Plan: General   Post-op Pain Management: Minimal or no pain anticipated   Induction: Intravenous  PONV Risk Score and Plan: Propofol infusion and TIVA  Airway Management Planned: Natural Airway and Nasal Cannula  Additional Equipment:   Intra-op Plan:   Post-operative Plan:   Informed Consent: I have reviewed the patients History and Physical, chart, labs and discussed the procedure including the risks, benefits and alternatives for the proposed anesthesia with the patient or authorized representative who has indicated his/her understanding and acceptance.     Dental Advisory Given and Interpreter used for  interveiw  Plan Discussed with: Anesthesiologist, CRNA and Surgeon  Anesthesia Plan Comments: (Patient consented for risks of anesthesia including but not limited to:  - adverse reactions to medications - risk of airway placement if required - damage to eyes, teeth, lips or other oral mucosa - nerve damage due to positioning  - sore throat or hoarseness - Damage to heart, brain, nerves, lungs, other parts of body or loss of life  Patient voiced understanding.)       Anesthesia Quick Evaluation

## 2023-03-14 NOTE — Op Note (Signed)
[facility] Gastroenterology Patient Name: Lawrence Mitchell Procedure Date: 03/14/2023 8:56 AM MRN: [id number] Account #: [id number] Date of Birth: [birth year] Admit Type: Outpatient Age: 67 Room: [facility] ENDO ROOM 3 Gender: Male Note Status: Finalized Instrument Name: Upper Endoscope [id number] Procedure:             Upper GI endoscopy Indications:           Abdominal pain Providers:             Wyline Mood MD, MD Referring MD:          Wyline Mood MD, MD (Referring MD), Guy Begin                         (Referring MD) Medicines:             Monitored Anesthesia Care Complications:         No immediate complications. Procedure:             Pre-Anesthesia Assessment:                        - Prior to the procedure, a History and Physical was                         performed, and patient medications, allergies and                         sensitivities were reviewed. The patient's tolerance                         of previous anesthesia was reviewed.                        - The risks and benefits of the procedure and the                         sedation options and risks were discussed with the                         patient. All questions were answered and informed                         consent was obtained.                        - ASA Grade Assessment: II - A patient with mild                         systemic disease.                        After obtaining informed consent, the endoscope was                         passed under direct vision. Throughout the procedure,                         the patient's blood pressure, pulse, and oxygen                         saturations were monitored  continuously. The Endoscope                         was introduced through the mouth, and advanced to the                         third part of duodenum. The upper GI endoscopy was                         accomplished with ease. The patient tolerated the                          procedure well. Findings:      The examined duodenum was normal.      The esophagus was normal.      The entire examined stomach was normal. Biopsies were taken with a cold       forceps for histology.      The cardia and gastric fundus were normal on retroflexion. Impression:            - Normal examined duodenum.                        - Normal esophagus.                        - Normal stomach. Biopsied. Recommendation:        - Await pathology results.                        - Perform a colonoscopy today. Procedure Code(s):     --- Professional ---                        [phone], Esophagogastroduodenoscopy, flexible,                         transoral; with biopsy, single or multiple Diagnosis Code(s):     --- Professional ---                        R10.9, Unspecified abdominal pain CPT copyright 2022 American Medical Association. All rights reserved. The codes documented in this report are preliminary and upon coder review may  be revised to meet current compliance requirements. Wyline Mood, MD Wyline Mood MD, MD 03/14/2023 9:13:48 AM This report has been signed electronically. Number of Addenda: 0 Note Initiated On: 03/14/2023 8:56 AM Estimated Blood Loss:  Estimated blood loss: none.      [facility]

## 2023-03-14 NOTE — Anesthesia Postprocedure Evaluation (Signed)
Anesthesia Post Note  Patient: Lawrence Mitchell  Procedure(s) Performed: COLONOSCOPY WITH PROPOFOL ESOPHAGOGASTRODUODENOSCOPY (EGD) BIOPSY POLYPECTOMY  Patient location during evaluation: Endoscopy Anesthesia Type: General Level of consciousness: awake and alert Pain management: pain level controlled Vital Signs Assessment: post-procedure vital signs reviewed and stable Respiratory status: spontaneous breathing, nonlabored ventilation, respiratory function stable and patient connected to nasal cannula oxygen Cardiovascular status: blood pressure returned to baseline and stable Postop Assessment: no apparent nausea or vomiting Anesthetic complications: no   No notable events documented.   Last Vitals:  Vitals:   03/14/23 0942 03/14/23 0952  BP: 113/71   Pulse: (!) 51 (!) 39  Resp: 20 20  Temp: (!) 35.7 C   SpO2: 97% 96%    Last Pain:  Vitals:   03/14/23 0942  TempSrc: Temporal  PainSc: Asleep                 Louie Boston

## 2023-03-14 NOTE — Op Note (Signed)
[facility] Gastroenterology Patient Name: Lawrence Mitchell Procedure Date: 03/14/2023 8:55 AM MRN: [id number] Account #: [id number] Date of Birth: Jul 20, 1956 Admit Type: Outpatient Age: 67 Room: [facility] ENDO ROOM 3 Gender: Male Note Status: Finalized Instrument Name: Prentice Docker [id number] Procedure:             Colonoscopy Indications:           Screening for colorectal malignant neoplasm Providers:             Wyline Mood MD, MD Referring MD:          Wyline Mood MD, MD (Referring MD), Guy Begin                         (Referring MD) Medicines:             Monitored Anesthesia Care Complications:         No immediate complications. Procedure:             Pre-Anesthesia Assessment:                        - Prior to the procedure, a History and Physical was                         performed, and patient medications, allergies and                         sensitivities were reviewed. The patient's tolerance                         of previous anesthesia was reviewed.                        - The risks and benefits of the procedure and the                         sedation options and risks were discussed with the                         patient. All questions were answered and informed                         consent was obtained.                        - ASA Grade Assessment: II - A patient with mild                         systemic disease.                        - ASA Grade Assessment: II - A patient with mild                         systemic disease.                        After obtaining informed consent, the colonoscope was                         passed under direct vision. Throughout the  procedure,                         the patient's blood pressure, pulse, and oxygen                         saturations were monitored continuously. The                         Colonoscope was introduced through the anus and                         advanced to the the cecum,  identified by the                         appendiceal orifice. The colonoscopy was performed                         with ease. The patient tolerated the procedure well.                         The quality of the bowel preparation was adequate. The                         ileocecal valve, appendiceal orifice, and rectum were                         photographed. Findings:      The perianal and digital rectal examinations were normal.      Two sessile polyps were found in the sigmoid colon. The polyps were 4 to       5 mm in size. These polyps were removed with a cold snare. Resection and       retrieval were complete.      Three sessile polyps were found in the ascending colon. The polyps were       4 to 5 mm in size. These polyps were removed with a cold snare.       Resection and retrieval were complete.      Four sessile polyps were found in the cecum. The polyps were 4 to 5 mm       in size. These polyps were removed with a cold snare. Resection and       retrieval were complete.      The exam was otherwise without abnormality on direct and retroflexion       views. Impression:            - Two 4 to 5 mm polyps in the sigmoid colon, removed                         with a cold snare. Resected and retrieved.                        - Three 4 to 5 mm polyps in the ascending colon,                         removed with a cold snare. Resected and retrieved.                        -  Four 4 to 5 mm polyps in the cecum, removed with a                         cold snare. Resected and retrieved.                        - The examination was otherwise normal on direct and                         retroflexion views. Recommendation:        - Discharge patient to home (with escort).                        - Resume previous diet.                        - Continue present medications.                        - Await pathology results.                        - Repeat colonoscopy in 3 years for  surveillance. Procedure Code(s):     --- Professional ---                        [phone], Colonoscopy, flexible; with removal of                         tumor(s), polyp(s), or other lesion(s) by snare                         technique Diagnosis Code(s):     --- Professional ---                        Z12.11, Encounter for screening for malignant neoplasm                         of colon                        D12.5, Benign neoplasm of sigmoid colon                        D12.2, Benign neoplasm of ascending colon                        D12.0, Benign neoplasm of cecum CPT copyright 2022 [employer]. All rights reserved. The codes documented in this report are preliminary and upon coder review may  be revised to meet current compliance requirements. Wyline Mood, MD Wyline Mood MD, MD 03/14/2023 9:41:45 AM This report has been signed electronically. Number of Addenda: 0 Note Initiated On: 03/14/2023 8:55 AM Scope Withdrawal Time: 0 hours 18 minutes 11 seconds  Total Procedure Duration: 0 hours 22 minutes 9 seconds  Estimated Blood Loss:  Estimated blood loss: none.      [facility]

## 2023-03-27 ENCOUNTER — Encounter: Payer: Self-pay | Admitting: Gastroenterology

## 2023-03-27 ENCOUNTER — Telehealth: Payer: Self-pay

## 2023-03-27 DIAGNOSIS — Z8619 Personal history of other infectious and parasitic diseases: Secondary | ICD-10-CM

## 2023-03-27 MED ORDER — OMEPRAZOLE 20 MG PO CPDR: 20.0000 mg | DELAYED_RELEASE_CAPSULE | Freq: Two times a day (BID) | ORAL | 0 refills | Status: AC

## 2023-03-27 MED ORDER — CLARITHROMYCIN 500 MG PO TABS: 500.0000 mg | ORAL_TABLET | Freq: Two times a day (BID) | ORAL | 0 refills | Status: AC

## 2023-03-27 MED ORDER — AMOXICILLIN 500 MG PO CAPS: 500.0000 mg | ORAL_CAPSULE | Freq: Two times a day (BID) | ORAL | 0 refills | Status: AC

## 2023-03-27 NOTE — Progress Notes (Signed)
Bx show H pylori gastritis  H pylori gastritis.   Suggest clarithromycin 500 mg PO BID, amoxicillin 1 gram BID, omeprazole 20 mg BID all for 14 days.  , check for penicillin allergy and will need repeat H pylori stool antigen to check for eradication after .

## 2023-03-27 NOTE — Telephone Encounter (Signed)
-----   Message from [facility] [place] sent at 03/27/2023  1:40 PM EDT ----- Please call patient-Thank you. ----- Message ----- From: Wyline Mood, MD Sent: 03/27/2023   1:28 PM EDT To: Martyn Ehrich, CMA; Celso Amy, PA-C  Bx show H pylori gastritis  H pylori gastritis.   Suggest clarithromycin 500 mg PO BID, amoxicillin 1 gram BID, omeprazole 20 mg BID all for 14 days.  , check for penicillin allergy and will need repeat H pylori stool antigen to check for eradication after .

## 2023-03-27 NOTE — Telephone Encounter (Signed)
Called patient but had to leave him a detailed message in spanish with the results and explaining why he is needing to start taking antibiotics for 14 days and that 6 weeks after his last dosage, he will need to com in to check if he has H Pylori again or not.  Patient's chart does not show as him having allergies to penicillin. I also checked his medical record from his PCP and it also didn't show allergy to penicillin.

## 2023-04-15 ENCOUNTER — Ambulatory Visit: Payer: Medicare HMO | Admitting: Physician Assistant

## 2023-04-15 NOTE — Progress Notes (Deleted)
Celso Amy, PA-C [address]  [address]  [place], [place] [address]  Main: [phone]  Fax: [phone]   Primary Care Physician: Guy Begin, MD  Primary Gastroenterologist:  Celso Amy, PA-C / Dr. Wyline Mood    CC: F/U elevated Alk Phos, Epig Pain  HPI: Lawrence Mitchell is a 67 y.o. male returns for 67-month follow-up of epigastric pain, fatty liver, isolated elevated alkaline phosphatase.  All other LFTs normal.  CT abdomen pelvis with contrast 02/2022 showed hepatic steatosis with no liver masses.  Incidental enteritis (infectious).   Labs 12/2022 showed alkaline phosphatase improved from 202 to 173.  Calcium and PTH normal.  TSH normal.  ANA, AMA, ASMA normal.  GGT normal.  EGD 03/14/2023 by Dr. Tobi Bastos was normal.  Gastric biopsies positive for H. pylori.  He was started on clarithromycin 500 mg twice daily, amoxicillin 1 g twice daily, and omeprazole 20 Mg twice daily for 14 days.  Colonoscopy 03/14/2023 showed 9 small (4 mm to 5 mm) tubular adenoma polyps removed throughout the colon.  Adequate prep.  3-year repeat.  Current Outpatient Medications  Medication Sig Dispense Refill   famotidine (PEPCID) 40 MG tablet Take 1 tablet (40 mg total) by mouth every evening. 30 tablet 0   metFORMIN (GLUCOPHAGE) 500 MG tablet Take by mouth.     naproxen (EC NAPROSYN) 500 MG EC tablet Take 1 tablet (500 mg total) by mouth 2 (two) times daily with a meal. 30 tablet 0   omeprazole (PRILOSEC) 20 MG capsule Take 1 capsule (20 mg total) by mouth 2 (two) times daily before a meal for 14 days. 28 capsule 0   ondansetron (ZOFRAN-ODT) 8 MG disintegrating tablet Take 1 tablet (8 mg total) by mouth every 8 (eight) hours as needed for nausea or vomiting. 20 tablet 0   traMADol (ULTRAM) 50 MG tablet Take 1 tablet (50 mg total) by mouth 2 (two) times daily. 10 tablet 0   No current facility-administered medications for this visit.    Allergies as of 04/15/2023   (No Known Allergies)     Past Medical History:  Diagnosis Date   Diabetes mellitus without complication (HCC)    High cholesterol    Hypertension     Past Surgical History:  Procedure Laterality Date   BIOPSY  03/14/2023   Procedure: BIOPSY;  Surgeon: Wyline Mood, MD;  Location[facility] ENDOSCOPY;  Service: Gastroenterology;;   COLONOSCOPY WITH PROPOFOL N/A 03/14/2023   Procedure: COLONOSCOPY WITH PROPOFOL;  Surgeon: Wyline Mood, MD;  Location: [facility] ENDOSCOPY;  Service: Gastroenterology;  Laterality: N/A;  SPANISH   ESOPHAGOGASTRODUODENOSCOPY N/A 03/14/2023   Procedure: ESOPHAGOGASTRODUODENOSCOPY (EGD);  Surgeon: Wyline Mood, MD;  Location: [facility] ENDOSCOPY;  Service: Gastroenterology;  Laterality: N/A;   POLYPECTOMY  03/14/2023   Procedure: POLYPECTOMY;  Surgeon: Wyline Mood, MD;  Location: [facility] ENDOSCOPY;  Service: Gastroenterology;;    Review of Systems:    All systems reviewed and negative except where noted in HPI.   Physical Examination:   There were no vitals taken for this visit.  General: Well-nourished, well-developed in no acute distress.  Lungs: Clear to auscultation bilaterally. Non-labored. Heart: Regular rate and rhythm, no murmurs rubs or gallops.  Abdomen: Bowel sounds are normal; Abdomen is Soft; No hepatosplenomegaly, masses or hernias;  No Abdominal Tenderness; No guarding or rebound tenderness. Neuro: Alert and oriented x 3.  Grossly intact.  Psych: Alert and cooperative, normal mood and affect.   Imaging Studies: No results found.  Assessment and Plan:  Lawrence Mitchell is a 67 y.o. y/o male returns for 21-month follow-up of epigastric pain recent EGD showed H. pylori gastritis treated with clarithromycin, amoxicillin, omeprazole.  Colonoscopy showed multiple small adenomatous polyps removed.  3-year repeat colonoscopy.  Needs follow-up H. pylori test to ensure eradication.  Lab workup for isolated elevated alkaline phosphatase was negative for GI pathology or etiology.  1.  H.  pylori gastritis   Check H. pylori test after off PPI for 2 weeks  2.  Adenomatous colon polyps  3-year repeat colonoscopy will be due 02/2026.  3.  Hepatic steatosis  Recommend a low-fat diet, regular exercise, and weight loss. Patient education handout about fatty liver disease was given and discussed from up-to-date.   4.  Elevated alkaline phosphatase -follow-up with PCP for further evaluation.  Not GI or liver in origin.  Follow-up with PCP for further evaluation.  Lab: Check Vit D?   Celso Amy, PA-C  Follow up ***  BP check ***
